# Patient Record
Sex: Male | Born: 1980 | Race: Black or African American | Hispanic: No | Marital: Single | Smoking: Current every day smoker
Health system: Southern US, Community
[De-identification: ages and names within clinical notes are randomized; demographics above are authoritative.]

## PROBLEM LIST (undated history)

## (undated) DIAGNOSIS — S4990XA Unspecified injury of shoulder and upper arm, unspecified arm, initial encounter: Secondary | ICD-10-CM

---
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
## 2014-01-28 ENCOUNTER — Encounter (HOSPITAL_COMMUNITY): Payer: Self-pay | Admitting: Emergency Medicine

## 2014-01-28 ENCOUNTER — Emergency Department (HOSPITAL_COMMUNITY): Payer: Self-pay

## 2014-01-28 ENCOUNTER — Emergency Department (HOSPITAL_COMMUNITY)
Admission: EM | Admit: 2014-01-28 | Discharge: 2014-01-28 | Disposition: A | Discharge status: Home / Self Care | Payer: Self-pay | Attending: Emergency Medicine | Admitting: Emergency Medicine

## 2014-01-28 DIAGNOSIS — Y9389 Activity, other specified: Secondary | ICD-10-CM | POA: Insufficient documentation

## 2014-01-28 DIAGNOSIS — S60229A Contusion of unspecified hand, initial encounter: Secondary | ICD-10-CM | POA: Insufficient documentation

## 2014-01-28 DIAGNOSIS — S60221A Contusion of right hand, initial encounter: Secondary | ICD-10-CM

## 2014-01-28 DIAGNOSIS — W208XXA Other cause of strike by thrown, projected or falling object, initial encounter: Secondary | ICD-10-CM | POA: Insufficient documentation

## 2014-01-28 DIAGNOSIS — F17200 Nicotine dependence, unspecified, uncomplicated: Secondary | ICD-10-CM | POA: Insufficient documentation

## 2014-01-28 DIAGNOSIS — Y9289 Other specified places as the place of occurrence of the external cause: Secondary | ICD-10-CM | POA: Insufficient documentation

## 2014-01-28 DIAGNOSIS — Y990 Civilian activity done for income or pay: Secondary | ICD-10-CM | POA: Insufficient documentation

## 2014-01-28 DIAGNOSIS — S6990XA Unspecified injury of unspecified wrist, hand and finger(s), initial encounter: Secondary | ICD-10-CM | POA: Insufficient documentation

## 2014-01-28 MED ORDER — IBUPROFEN 200 MG PO TABS
600.0000 mg | ORAL_TABLET | Freq: Once | ORAL | Status: AC
  Administered 2014-01-28: 600 mg via ORAL
  Filled 2014-01-28: qty 3

## 2014-01-28 MED ORDER — HYDROCODONE-ACETAMINOPHEN 5-325 MG PO TABS
1.0000 | ORAL_TABLET | Freq: Once | ORAL | Status: AC
  Administered 2014-01-28: 1 via ORAL
  Filled 2014-01-28: qty 1

## 2014-01-28 MED ORDER — HYDROCODONE-ACETAMINOPHEN 5-325 MG PO TABS: 1.0000 | ORAL_TABLET | Freq: Four times a day (QID) | ORAL | Status: DC | PRN

## 2014-01-28 NOTE — ED Notes (Signed)
Pt had a volt fall on hand at work

## 2014-01-28 NOTE — ED Provider Notes (Signed)
Medical screening examination/treatment/procedure(s) were performed by non-physician practitioner and as supervising physician I was immediately available for consultation/collaboration.   EKG Interpretation None        Gilda Creasehristopher J. Pollina, MD 01/28/14 2104

## 2014-01-28 NOTE — ED Provider Notes (Signed)
CSN: [id number]     Arrival date & time 01/28/14  1951 History  This chart was scribed for non-physician practitioner, Earley Favor, FNP,working with Gilda Crease, MD, by Karle Plumber, ED Scribe.  This patient was seen in room WTR5/WTR5 and the patient's care was started at 8:01 PM.  Chief Complaint  Patient presents with  . Hand Injury   The history is provided by the patient. No language interpreter was used.   HPI Comments:  John Bates is a 33 y.o. male who presents to the Emergency Department complaining of moderate right hand pain, bruising, and swelling that occurred yesterday. Pt states a large wooden vault fell on his right hand while he was working. He denies taking anything for pain since the incident. He denies numbness or tingling of the hand.   No past medical history on file. No past surgical history on file. History reviewed. No pertinent family history. History  Substance Use Topics  . Smoking status: Current Every Day Smoker    Types: Cigarettes  . Smokeless tobacco: Not on file  . Alcohol Use: Yes     Comment: occassional    Review of Systems  Musculoskeletal: Positive for joint swelling.  Skin: Negative for wound.  Neurological: Negative for numbness.  All other systems reviewed and are negative.   Allergies  Review of patient's allergies indicates no known allergies.  Home Medications   Prior to Admission medications   Not on File   Triage Vitals: BP 146/86  Pulse 90  Temp(Src) 98.2 F (36.8 C) (Oral)  Resp 20  SpO2 99% Physical Exam  Nursing note and vitals reviewed. Constitutional: He is oriented to person, place, and time. He appears well-developed and well-nourished.  HENT:  Head: Normocephalic and atraumatic.  Eyes: EOM are normal.  Neck: Normal range of motion.  Cardiovascular: Normal rate.   Pulmonary/Chest: Effort normal.  Musculoskeletal: Normal range of motion. He exhibits edema and tenderness.  Swelling over the  proximal joints of second and third fingers of right hand. Slightly decreased ROM.  Neurological: He is alert and oriented to person, place, and time.  Skin: Skin is warm and dry.  Psychiatric: He has a normal mood and affect. His behavior is normal.    ED Course  Procedures (including critical care time) DIAGNOSTIC STUDIES: Oxygen Saturation is 99% on RA, normal by my interpretation.   COORDINATION OF CARE: 8:04 PM- Will X-Ray right hand. Pt verbalizes understanding and agrees to plan.  Medications  HYDROcodone-acetaminophen (NORCO/VICODIN) 5-325 MG per tablet 1 tablet (1 tablet Oral Given 01/28/14 2042)  ibuprofen (ADVIL,MOTRIN) tablet 600 mg (600 mg Oral Given 01/28/14 2043)    Labs Review Labs Reviewed - No data to display  Imaging Review Dg Hand Complete Right  01/28/2014   CLINICAL DATA:  Recent trauma with pain  EXAM: RIGHT HAND - COMPLETE 3+ VIEW  COMPARISON:  None.  FINDINGS: No acute fracture or dislocation is noted. There are changes consistent with prior fracture of the fifth metacarpal with healing. No gross soft tissue abnormality is seen.  IMPRESSION: No acute abnormality noted.   Electronically Signed   By: Alcide Clever M.D.   On: 01/28/2014 20:31     EKG Interpretation None      MDM   Final diagnoses:  None   Will obtain xray and provide pain control Patient informed of xray results  Hand wrapped in ACE bandage for comfort     I personally performed the services described in this documentation,  which was scribed in my presence. The recorded information has been reviewed and is accurate.    Arman Filter, NP 01/28/14 [phone]

## 2014-01-28 NOTE — Discharge Instructions (Signed)
Your xray is normal

## 2014-02-02 ENCOUNTER — Ambulatory Visit: Payer: Self-pay | Attending: Internal Medicine | Admitting: Internal Medicine

## 2014-02-02 ENCOUNTER — Encounter: Payer: Self-pay | Admitting: Internal Medicine

## 2014-02-02 VITALS — BP 131/80 | HR 78 | Temp 98.4°F | Resp 16 | Ht 75.0 in | Wt 222.0 lb

## 2014-02-02 DIAGNOSIS — F17200 Nicotine dependence, unspecified, uncomplicated: Secondary | ICD-10-CM | POA: Insufficient documentation

## 2014-02-02 DIAGNOSIS — S6991XS Unspecified injury of right wrist, hand and finger(s), sequela: Secondary | ICD-10-CM

## 2014-02-02 DIAGNOSIS — Y990 Civilian activity done for income or pay: Secondary | ICD-10-CM | POA: Insufficient documentation

## 2014-02-02 DIAGNOSIS — Y9289 Other specified places as the place of occurrence of the external cause: Secondary | ICD-10-CM | POA: Insufficient documentation

## 2014-02-02 DIAGNOSIS — IMO0001 Reserved for inherently not codable concepts without codable children: Secondary | ICD-10-CM

## 2014-02-02 DIAGNOSIS — IMO0002 Reserved for concepts with insufficient information to code with codable children: Secondary | ICD-10-CM | POA: Insufficient documentation

## 2014-02-02 DIAGNOSIS — S6990XA Unspecified injury of unspecified wrist, hand and finger(s), initial encounter: Secondary | ICD-10-CM | POA: Insufficient documentation

## 2014-02-02 MED ORDER — HYDROCODONE-ACETAMINOPHEN 5-325 MG PO TABS: 1.0000 | ORAL_TABLET | Freq: Four times a day (QID) | ORAL | Status: DC | PRN

## 2014-02-02 NOTE — Progress Notes (Signed)
Patient ID: John Bates, male   DOB: 04-07-1981, 33 y.o.   MRN: [id number]  [id number]:[id number]  [id number]  DOB - [birth year]  CC:  Chief Complaint  Patient presents with  . Establish Care  . Hand Injury    right       HPI: John Bates is a 33 y.o. male here today to establish medical care.  Patient reports that he smashed his hand in a wooden vault at work a few days ago and was evaluated in the ER.  X-rays reveal that had was not broken.  Patient has been taking vicodin for pain.  Patient reports that he has to work and that he is unable to afford being out of work.  Patient reports that he has continued to use that hand for work since day after injury.   No Known Allergies History reviewed. No pertinent past medical history. Current Outpatient Prescriptions on File Prior to Visit  Medication Sig Dispense Refill  . HYDROcodone-acetaminophen (NORCO/VICODIN) 5-325 MG per tablet Take 1 tablet by mouth every 6 (six) hours as needed for moderate pain.  12 tablet  0   No current facility-administered medications on file prior to visit.   History reviewed. No pertinent family history. History   Social History  . Marital Status: Single    Spouse Name: N/A    Number of Children: N/A  . Years of Education: N/A   Occupational History  . Not on file.   Social History Main Topics  . Smoking status: Current Every Day Smoker    Types: Cigarettes  . Smokeless tobacco: Not on file  . Alcohol Use: Yes     Comment: occassional  . Drug Use: No  . Sexual Activity: Not on file   Other Topics Concern  . Not on file   Social History Narrative  . No narrative on file    Review of Systems: Constitutional: Negative for fever, chills, diaphoresis, activity change, appetite change and fatigue. HENT: Negative for ear pain, nosebleeds, congestion, facial swelling, rhinorrhea, neck pain, neck stiffness and ear discharge.  Eyes: Negative for pain, discharge, redness, itching and visual  disturbance. Respiratory: Negative for cough, choking, chest tightness, shortness of breath, wheezing and stridor.  Cardiovascular: Negative for chest pain, palpitations and leg swelling. Gastrointestinal: Negative for abdominal distention. Genitourinary: Negative for dysuria, urgency, frequency, hematuria, flank pain, decreased urine volume, difficulty urinating and dyspareunia.  Musculoskeletal: Negative for back pain, joint swelling, arthralgia and gait problem. Neurological: Negative for dizziness, tremors, seizures, syncope, facial asymmetry, speech difficulty, weakness, light-headedness, numbness and headaches.  Hematological: Negative for adenopathy. Does not bruise/bleed easily. Psychiatric/Behavioral: Negative for hallucinations, behavioral problems, confusion, dysphoric mood, decreased concentration and agitation.    Objective:   Filed Vitals:   02/02/14 1105  BP: 131/80  Pulse: 78  Temp: 98.4 F (36.9 C)  Resp: 16    Physical Exam: Constitutional: Patient appears well-developed and well-nourished. No distress. HENT: Normocephalic, atraumatic, External right and left ear normal. Oropharynx is clear and moist.  Eyes: Conjunctivae and EOM are normal. PERRLA, no scleral icterus. Neck: Normal ROM. Neck supple. No JVD. No tracheal deviation. No thyromegaly. CVS: RRR, S1/S2 +, no murmurs, no gallops, no carotid bruit.  Pulmonary: Effort and breath sounds normal, no stridor, rhonchi, wheezes, rales.  Abdominal: Soft. BS +, no distension, tenderness, rebound or guarding.  Musculoskeletal: Normal range of motion.Right hand dorsum swelling and tenderness to palpate. Lymphadenopathy: No lymphadenopathy noted, cervical Neuro: Alert. Normal reflexes, muscle tone coordination. No cranial nerve deficit.  Skin: Skin is warm and dry. No rash noted. Not diaphoretic. No erythema. No pallor. Psychiatric: Normal mood and affect. Behavior, judgment, thought content normal.  No results found  for this basename: WBC, HGB, HCT, MCV, PLT   No results found for this basename: CREATININE, BUN, NA, K, CL, CO2    No results found for this basename: HGBA1C   Lipid Panel  No results found for this basename: chol, trig, hdl, cholhdl, vldl, ldlcalc       Assessment and plan:   Brayton CavesJessie was seen today for establish care and hand injury.  Diagnoses and associated orders for this visit:  Hand injury, right, sequela - HYDROcodone-acetaminophen (NORCO/VICODIN) 5-325 MG per tablet; Take 1 tablet by mouth every 6 (six) hours as needed for moderate pain. Patient will be given one time order of vicodin. Explained to patient that he will be unable to RTC for further pain medication. Narcotic policy explained in detail.   Return if symptoms worsen or fail to improve.   Holland CommonsKECK, Marcelis Wissner, NP-C [facility] [phone] 02/20/2014, 11:14 PM

## 2014-02-02 NOTE — Patient Instructions (Addendum)
Smoking Cessation Quitting smoking is important to your health and has many advantages. However, it is not always easy to quit since nicotine is a very addictive drug. Oftentimes, people try 3 times or more before being able to quit. This document explains the best ways for you to prepare to quit smoking. Quitting takes hard work and a lot of effort, but you can do it. ADVANTAGES OF QUITTING SMOKING  You will live longer, feel better, and live better.  Your body will feel the impact of quitting smoking almost immediately.  Within 20 minutes, blood pressure decreases. Your pulse returns to its normal level.  After 8 hours, carbon monoxide levels in the blood return to normal. Your oxygen level increases.  After 24 hours, the chance of having a heart attack starts to decrease. Your breath, hair, and body stop smelling like smoke.  After 48 hours, damaged nerve endings begin to recover. Your sense of taste and smell improve.  After 72 hours, the body is virtually free of nicotine. Your bronchial tubes relax and breathing becomes easier.  After 2 to 12 weeks, lungs can hold more air. Exercise becomes easier and circulation improves.  The risk of having a heart attack, stroke, cancer, or lung disease is greatly reduced.  After 1 year, the risk of coronary heart disease is cut in half.  After 5 years, the risk of stroke falls to the same as a nonsmoker.  After 10 years, the risk of lung cancer is cut in half and the risk of other cancers decreases significantly.  After 15 years, the risk of coronary heart disease drops, usually to the level of a nonsmoker.  If you are pregnant, quitting smoking will improve your chances of having a healthy baby.  The people you live with, especially any children, will be healthier.  You will have extra money to spend on things other than cigarettes. QUESTIONS TO THINK ABOUT BEFORE ATTEMPTING TO QUIT You may want to talk about your answers with your  health care provider.  Why do you want to quit?  If you tried to quit in the past, what helped and what did not?  What will be the most difficult situations for you after you quit? How will you plan to handle them?  Who can help you through the tough times? Your family? Friends? A health care provider?  What pleasures do you get from smoking? What ways can you still get pleasure if you quit? Here are some questions to ask your health care provider:  How can you help me to be successful at quitting?  What medicine do you think would be best for me and how should I take it?  What should I do if I need more help?  What is smoking withdrawal like? How can I get information on withdrawal? GET READY  Set a quit date.  Change your environment by getting rid of all cigarettes, ashtrays, matches, and lighters in your home, car, or work. Do not let people smoke in your home.  Review your past attempts to quit. Think about what worked and what did not. GET SUPPORT AND ENCOURAGEMENT You have a better chance of being successful if you have help. You can get support in many ways.  Tell your family, friends, and coworkers that you are going to quit and need their support. Ask them not to smoke around you.  Get individual, group, or telephone counseling and support. Programs are available at local hospitals and health centers. Call   your local health department for information about programs in your area.  Spiritual beliefs and practices may help some smokers quit.  Download a "quit meter" on your computer to keep track of quit statistics, such as how long you have gone without smoking, cigarettes not smoked, and money saved.  Get a self-help book about quitting smoking and staying off tobacco. LEARN NEW SKILLS AND BEHAVIORS  Distract yourself from urges to smoke. Talk to someone, go for a walk, or occupy your time with a task.  Change your normal routine. Take a different route to work.  Drink tea instead of coffee. Eat breakfast in a different place.  Reduce your stress. Take a hot bath, exercise, or read a book.  Plan something enjoyable to do every day. Reward yourself for not smoking.  Explore interactive web-based programs that specialize in helping you quit. GET MEDICINE AND USE IT CORRECTLY Medicines can help you stop smoking and decrease the urge to smoke. Combining medicine with the above behavioral methods and support can greatly increase your chances of successfully quitting smoking.  Nicotine replacement therapy helps deliver nicotine to your body without the negative effects and risks of smoking. Nicotine replacement therapy includes nicotine gum, lozenges, inhalers, nasal sprays, and skin patches. Some may be available over-the-counter and others require a prescription.  Antidepressant medicine helps people abstain from smoking, but how this works is unknown. This medicine is available by prescription.  Nicotinic receptor partial agonist medicine simulates the effect of nicotine in your brain. This medicine is available by prescription. Ask your health care provider for advice about which medicines to use and how to use them based on your health history. Your health care provider will tell you what side effects to look out for if you choose to be on a medicine or therapy. Carefully read the information on the package. Do not use any other product containing nicotine while using a nicotine replacement product.  RELAPSE OR DIFFICULT SITUATIONS Most relapses occur within the first 3 months after quitting. Do not be discouraged if you start smoking again. Remember, most people try several times before finally quitting. You may have symptoms of withdrawal because your body is used to nicotine. You may crave cigarettes, be irritable, feel very hungry, cough often, get headaches, or have difficulty concentrating. The withdrawal symptoms are only temporary. They are strongest  when you first quit, but they will go away within 10-14 days. To reduce the chances of relapse, try to:  Avoid drinking alcohol. Drinking lowers your chances of successfully quitting.  Reduce the amount of caffeine you consume. Once you quit smoking, the amount of caffeine in your body increases and can give you symptoms, such as a rapid heartbeat, sweating, and anxiety.  Avoid smokers because they can make you want to smoke.  Do not let weight gain distract you. Many smokers will gain weight when they quit, usually less than 10 pounds. Eat a healthy diet and stay active. You can always lose the weight gained after you quit.  Find ways to improve your mood other than smoking. FOR MORE INFORMATION  www.smokefree.gov  Document Released: 06/11/2001 Document Revised: 11/01/2013 Document Reviewed: 09/26/2011 Exit[employer] Patient Information 2015 [employer]. This information is not intended to replace advice given to you by your health care provider. Make sure you discuss any questions you have with your health care provider.  

## 2014-02-02 NOTE — Progress Notes (Signed)
Patient presents to establish care Recently in ED for right hand injury at work Rates 10/10 at present Wants to quit smoking

## 2014-03-24 ENCOUNTER — Encounter (HOSPITAL_COMMUNITY): Payer: Self-pay | Admitting: Emergency Medicine

## 2014-03-24 ENCOUNTER — Emergency Department (HOSPITAL_COMMUNITY)
Admission: EM | Admit: 2014-03-24 | Discharge: 2014-03-24 | Disposition: A | Discharge status: Home / Self Care | Payer: Self-pay | Attending: Emergency Medicine | Admitting: Emergency Medicine

## 2014-03-24 ENCOUNTER — Emergency Department (HOSPITAL_COMMUNITY): Payer: Self-pay

## 2014-03-24 DIAGNOSIS — Y9389 Activity, other specified: Secondary | ICD-10-CM | POA: Insufficient documentation

## 2014-03-24 DIAGNOSIS — Z791 Long term (current) use of non-steroidal anti-inflammatories (NSAID): Secondary | ICD-10-CM | POA: Insufficient documentation

## 2014-03-24 DIAGNOSIS — Y9289 Other specified places as the place of occurrence of the external cause: Secondary | ICD-10-CM | POA: Insufficient documentation

## 2014-03-24 DIAGNOSIS — X500XXA Overexertion from strenuous movement or load, initial encounter: Secondary | ICD-10-CM | POA: Insufficient documentation

## 2014-03-24 DIAGNOSIS — S46909A Unspecified injury of unspecified muscle, fascia and tendon at shoulder and upper arm level, unspecified arm, initial encounter: Secondary | ICD-10-CM | POA: Insufficient documentation

## 2014-03-24 DIAGNOSIS — F17200 Nicotine dependence, unspecified, uncomplicated: Secondary | ICD-10-CM | POA: Insufficient documentation

## 2014-03-24 DIAGNOSIS — Y990 Civilian activity done for income or pay: Secondary | ICD-10-CM | POA: Insufficient documentation

## 2014-03-24 DIAGNOSIS — S4991XA Unspecified injury of right shoulder and upper arm, initial encounter: Secondary | ICD-10-CM

## 2014-03-24 DIAGNOSIS — S4980XA Other specified injuries of shoulder and upper arm, unspecified arm, initial encounter: Secondary | ICD-10-CM | POA: Insufficient documentation

## 2014-03-24 MED ORDER — HYDROCODONE-ACETAMINOPHEN 5-325 MG PO TABS
2.0000 | ORAL_TABLET | Freq: Once | ORAL | Status: AC
  Administered 2014-03-24: 2 via ORAL
  Filled 2014-03-24: qty 2

## 2014-03-24 MED ORDER — CYCLOBENZAPRINE HCL 10 MG PO TABS: 10.0000 mg | ORAL_TABLET | Freq: Two times a day (BID) | ORAL | Status: DC | PRN

## 2014-03-24 MED ORDER — HYDROCODONE-ACETAMINOPHEN 5-325 MG PO TABS: 2.0000 | ORAL_TABLET | ORAL | Status: AC | PRN

## 2014-03-24 NOTE — ED Provider Notes (Signed)
CSN: [id number]     Arrival date & time 03/24/14  1652 History  This chart was scribed for non-physician practitioner working with Audree Camel, MD, by Roxy Cedar ED Scribe. This patient was seen in room WTR6/WTR6 and the patient's care was started at 6:27 PM   Chief Complaint  Patient presents with  . Shoulder Pain   The history is provided by the patient. No language interpreter was used.    HPI Comments: John Bates is a 33 y.o. 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 He states that it hurts to raise his arm above the height of his chest.   History reviewed. No pertinent past medical history. Past Surgical History  Procedure Laterality Date  . Frontal sinusotomy Bilateral 1991   No family history on file. History  Substance Use Topics  . Smoking status: Current Every Day Smoker    Types: Cigarettes  . Smokeless tobacco: Not on file  . Alcohol Use: Yes     Comment: occassional    Review of Systems  Musculoskeletal: Positive for arthralgias and joint swelling.       Right shoulder pain.  Skin: Negative for rash and wound.  All other systems reviewed and are negative.  Allergies  Review of patient's allergies indicates no known allergies.  Home Medications   Prior to Admission medications   Medication Sig Start Date End Date Taking? Authorizing Provider  HYDROcodone-acetaminophen (NORCO/VICODIN) 5-325 MG per tablet Take 1 tablet by mouth every 6 (six) hours as needed for moderate pain. 02/02/14   Ambrose Finland, NP  ibuprofen (ADVIL,MOTRIN) 200 MG tablet Take 200 mg by mouth every 6 (six) hours as needed for moderate pain.    Historical Provider, MD   Triage Vitals: BP 131/82  Pulse 86  Temp(Src) 98 F (36.7 C) (Oral)  Resp 16  SpO2 100%  Physical Exam  Nursing note  and vitals reviewed. Constitutional: He is oriented to person, place, and time. He appears well-developed and well-nourished. No distress.  HENT:  Head: Normocephalic and atraumatic.  Eyes: Conjunctivae and EOM are normal.  Neck: Neck supple. No tracheal deviation present.  Cardiovascular: Normal rate.   Pulmonary/Chest: Effort normal. No respiratory distress.  Musculoskeletal: Normal range of motion.  Right shoulder tenderness to palpation over AC joint. No obvious deformity of right shoulder. Slightly limited range of motion due to pain.  Neurological: He is alert and oriented to person, place, and time.  Bilateral strength intact and equal. Bilateral strength intact in arms.  Skin: Skin is warm and dry.  Psychiatric: He has a normal mood and affect. His behavior is normal.    ED Course  Procedures (including critical care time)  DIAGNOSTIC STUDIES: Oxygen Saturation is 100% on room air, normal by my interpretation.    COORDINATION OF CARE: 6:31 PM- Discussed normal imaging results with patient. Will give patient medications for pain management. Pt advised of plan for treatment and pt agrees.  Labs Review Labs Reviewed - No data to display  Imaging Review Dg Shoulder Right  03/24/2014   CLINICAL DATA:  Injuring right shoulder lifting heavy boxes yesterday. Pain of right shoulder.  EXAM: RIGHT SHOULDER - 2+ VIEW  COMPARISON:  None.  FINDINGS: There is no evidence of fracture or dislocation. Soft tissues are unremarkable.  IMPRESSION: No acute fracture or  dislocation.   Electronically Signed   By: Sherian Rein M.D.   On: 03/24/2014 17:44     EKG Interpretation None     MDM   Final diagnoses:  Right shoulder injury, initial encounter    6:59 PM Patient's xray unremarkable for acute changes. I suspect patient has a rotator cuff or AC joint injury based on physical exam. No neurovascular compromise. Vitals stable and patient afebrile. Patient will have Vicodin and Flexeril  for pain and instructions to follow up with Orthopedic surgeon. No other injury.   I personally performed the services described in this documentation, which was scribed in my presence. The recorded information has been reviewed and is accurate.     Emilia Beck, PA-C 03/24/14 1901

## 2014-03-24 NOTE — Discharge Instructions (Signed)
Take vicodin as needed for pain. Take Flexeril as needed for muscle spasm. You may take these medications together. Follow up with Dr. Turner Daniels for further evaluation. Refer to attached documents for more information.

## 2014-03-24 NOTE — ED Notes (Signed)
Pt c/o rt shoulder pain.  Has rotator cuff problems.  States he works for a [employer] and he was carrying something heavy.

## 2014-03-29 NOTE — ED Provider Notes (Signed)
Medical screening examination/treatment/procedure(s) were performed by non-physician practitioner and as supervising physician I was immediately available for consultation/collaboration.   EKG Interpretation None        Jettie Lazare T Israel Werts, MD 03/29/14 1605 

## 2014-08-31 ENCOUNTER — Emergency Department (HOSPITAL_COMMUNITY)
Admission: EM | Admit: 2014-08-31 | Discharge: 2014-08-31 | Disposition: A | Discharge status: Home / Self Care | Payer: Self-pay | Attending: Emergency Medicine | Admitting: Emergency Medicine

## 2014-08-31 ENCOUNTER — Encounter (HOSPITAL_COMMUNITY): Payer: Self-pay | Admitting: *Deleted

## 2014-08-31 ENCOUNTER — Emergency Department (HOSPITAL_COMMUNITY): Payer: Self-pay

## 2014-08-31 DIAGNOSIS — M25511 Pain in right shoulder: Secondary | ICD-10-CM

## 2014-08-31 DIAGNOSIS — Y9289 Other specified places as the place of occurrence of the external cause: Secondary | ICD-10-CM | POA: Insufficient documentation

## 2014-08-31 DIAGNOSIS — Z720 Tobacco use: Secondary | ICD-10-CM | POA: Insufficient documentation

## 2014-08-31 DIAGNOSIS — S4991XA Unspecified injury of right shoulder and upper arm, initial encounter: Secondary | ICD-10-CM | POA: Insufficient documentation

## 2014-08-31 DIAGNOSIS — S30810A Abrasion of lower back and pelvis, initial encounter: Secondary | ICD-10-CM | POA: Insufficient documentation

## 2014-08-31 DIAGNOSIS — Z79899 Other long term (current) drug therapy: Secondary | ICD-10-CM | POA: Insufficient documentation

## 2014-08-31 DIAGNOSIS — Y998 Other external cause status: Secondary | ICD-10-CM | POA: Insufficient documentation

## 2014-08-31 DIAGNOSIS — W01198A Fall on same level from slipping, tripping and stumbling with subsequent striking against other object, initial encounter: Secondary | ICD-10-CM | POA: Insufficient documentation

## 2014-08-31 DIAGNOSIS — S6991XS Unspecified injury of right wrist, hand and finger(s), sequela: Secondary | ICD-10-CM

## 2014-08-31 DIAGNOSIS — S20411A Abrasion of right back wall of thorax, initial encounter: Secondary | ICD-10-CM

## 2014-08-31 DIAGNOSIS — Y9389 Activity, other specified: Secondary | ICD-10-CM | POA: Insufficient documentation

## 2014-08-31 DIAGNOSIS — M546 Pain in thoracic spine: Secondary | ICD-10-CM

## 2014-08-31 DIAGNOSIS — S299XXA Unspecified injury of thorax, initial encounter: Secondary | ICD-10-CM | POA: Insufficient documentation

## 2014-08-31 MED ORDER — CYCLOBENZAPRINE HCL 10 MG PO TABS: 10.0000 mg | ORAL_TABLET | Freq: Two times a day (BID) | ORAL | Status: AC | PRN

## 2014-08-31 MED ORDER — IBUPROFEN 200 MG PO TABS: 400.0000 mg | ORAL_TABLET | Freq: Four times a day (QID) | ORAL | Status: AC | PRN

## 2014-08-31 MED ORDER — HYDROCODONE-ACETAMINOPHEN 5-325 MG PO TABS: 1.0000 | ORAL_TABLET | Freq: Four times a day (QID) | ORAL | Status: AC | PRN

## 2014-08-31 MED ORDER — OXYCODONE-ACETAMINOPHEN 5-325 MG PO TABS
1.0000 | ORAL_TABLET | Freq: Once | ORAL | Status: AC
  Administered 2014-08-31: 1 via ORAL
  Filled 2014-08-31: qty 1

## 2014-08-31 NOTE — ED Notes (Signed)
Patient has not returned from Xray.  

## 2014-08-31 NOTE — ED Provider Notes (Signed)
CSN: [id number]     Arrival date & time 08/31/14  1439 History  This chart was scribed for Junius Finner, PA-C, working with Harrold Donath R. Rubin Payor, MD by Chestine Spore, ED Scribe. The patient was seen in room TR07C/TR07C at 4:27 PM.    Chief Complaint  Patient presents with  . Shoulder Pain  . Back Pain      The history is provided by the patient. No language interpreter was used.    HPI Comments: John Bates is a 34 y.o. male who presents to the Emergency Department complaining of constant right shoulder pain onset 2 days ago. Pt describes the right shoulder pain as aching, throbbing, and tender.  Pain is 10/10 at worst. Pt was helping a friend move and a moving truck door swung and hit the pt in the back. Pt has had a right clavicle injury 3 months ago and he was referred to see a specialist but he has not yet. He states that he is having associated symptoms of back pain. He states that he has tried ibuprofen with no relief for his symptoms. He denies any other symptoms.   History reviewed. No pertinent past medical history. Past Surgical History  Procedure Laterality Date  . Frontal sinusotomy Bilateral 1991   History reviewed. No pertinent family history. History  Substance Use Topics  . Smoking status: Current Every Day Smoker    Types: Cigarettes  . Smokeless tobacco: Not on file  . Alcohol Use: Yes     Comment: occassional    Review of Systems  Musculoskeletal: Positive for myalgias, back pain and arthralgias.      Allergies  Review of patient's allergies indicates no known allergies.  Home Medications   Prior to Admission medications   Medication Sig Start Date End Date Taking? Authorizing Provider  cyclobenzaprine (FLEXERIL) 10 MG tablet Take 1 tablet (10 mg total) by mouth 2 (two) times daily as needed for muscle spasms. 08/31/14   Junius Finner, PA-C  HYDROcodone-acetaminophen (NORCO/VICODIN) 5-325 MG per tablet Take 2 tablets by mouth every 4 (four) hours as  needed for moderate pain or severe pain. Patient not taking: Reported on 08/31/2014 03/24/14   Emilia Beck, PA-C  HYDROcodone-acetaminophen (NORCO/VICODIN) 5-325 MG per tablet Take 1 tablet by mouth every 6 (six) hours as needed for moderate pain. 08/31/14   Junius Finner, PA-C  ibuprofen (ADVIL,MOTRIN) 200 MG tablet Take 2 tablets (400 mg total) by mouth every 6 (six) hours as needed for moderate pain. 08/31/14   Junius Finner, PA-C   BP 140/88 mmHg  Pulse 74  Temp(Src) 98.5 F (36.9 C) (Oral)  Resp 14  SpO2 97% Physical Exam  Constitutional: He is oriented to person, place, and time. He appears well-developed and well-nourished.  HENT:  Head: Normocephalic and atraumatic.  Eyes: EOM are normal.  Neck: Normal range of motion.  Cardiovascular: Normal rate.   Pulses:      Radial pulses are 2+ on the right side.  Pulmonary/Chest: Effort normal.  Musculoskeletal: Normal range of motion.       Right shoulder: He exhibits tenderness. He exhibits no crepitus and no deformity.       Cervical back: Normal. He exhibits no tenderness.       Thoracic back: He exhibits edema.       Lumbar back: Normal. He exhibits no tenderness.  Back: Midline spinal tenderness to mid thoracic spine and right side thoracic muscles. Mild edema with overlying abrasion. No cervical or lumbar tenderness.   Right  shoulder: no obvious deformity. Limited abduction to 110 degrees due to pain. Tenderness over AC joint, no crepitus. 5/5 grip strength bilaterally.   Neurological: He is alert and oriented to person, place, and time.  Skin: Skin is warm and dry.  Psychiatric: He has a normal mood and affect. His behavior is normal.  Nursing note and vitals reviewed.   ED Course  Procedures (including critical care time) DIAGNOSTIC STUDIES: Oxygen Saturation is 100% on room air, normal by my interpretation.    COORDINATION OF CARE: 4:34 PM-Discussed treatment plan which includes right shoulder X-ray, percocet, Ice, and  T-spine x-ray with pt at bedside and pt agreed to plan.   Labs Review Labs Reviewed - No data to display  Imaging Review Dg Thoracic Spine 2 View  08/31/2014   CLINICAL DATA:  Thoracic back pain. Hit in back with truck door 2 days ago. Abrasion to the right lower thoracic spine. Initial encounter.  EXAM: THORACIC SPINE - 2 VIEW  COMPARISON:  None  FINDINGS: Slight curvature of the thoracic spine may be positional. No listhesis is identified. Vertebral body heights are preserved. Intervertebral disc space heights are preserved. Visualized portions of the lungs are grossly clear.  IMPRESSION: Negative.   Electronically Signed   By: Sebastian AcheAllen  Grady   On: 08/31/2014 17:29   Dg Shoulder Right  08/31/2014   CLINICAL DATA:  Right shoulder pain post injury 2 days ago  EXAM: RIGHT SHOULDER - 2+ VIEW  COMPARISON:  03/24/2014  FINDINGS: Three views of the right shoulder submitted. No acute fracture or or subluxation. Glenohumeral joint is preserved. AC joint is unremarkable.  IMPRESSION: Negative.   Electronically Signed   By: Natasha MeadLiviu  Pop M.D.   On: 08/31/2014 15:46      EKG Interpretation None      MDM   Final diagnoses:  Right-sided thoracic back pain  Abrasion of back, right, initial encounter  Right shoulder pain    Plain films: negative for acute injury.  Will tx symptomatically. Advised to f/u with PCP as needed for recheck of symptoms. Return precautions provided. Pt verbalized understanding and agreement with tx plan.   I personally performed the services described in this documentation, which was scribed in my presence. The recorded information has been reviewed and is accurate.    Junius Finnerrin O'Malley, PA-C 08/31/14 2318  Juliet RudeNathan R. Rubin PayorPickering, MD 09/01/14 [id number]

## 2014-08-31 NOTE — ED Notes (Signed)
Pt in stating that he fell while helping a friend move and hit his back and right shoulder, c/o continued pain to these areas, no distress noted

## 2014-12-06 ENCOUNTER — Emergency Department (HOSPITAL_COMMUNITY)
Admission: EM | Admit: 2014-12-06 | Discharge: 2014-12-06 | Disposition: A | Discharge status: Home / Self Care | Payer: Self-pay | Attending: Emergency Medicine | Admitting: Emergency Medicine

## 2014-12-06 ENCOUNTER — Encounter (HOSPITAL_COMMUNITY): Payer: Self-pay | Admitting: Emergency Medicine

## 2014-12-06 ENCOUNTER — Emergency Department (HOSPITAL_COMMUNITY): Payer: Self-pay

## 2014-12-06 DIAGNOSIS — Y9389 Activity, other specified: Secondary | ICD-10-CM | POA: Insufficient documentation

## 2014-12-06 DIAGNOSIS — Z720 Tobacco use: Secondary | ICD-10-CM | POA: Insufficient documentation

## 2014-12-06 DIAGNOSIS — Y990 Civilian activity done for income or pay: Secondary | ICD-10-CM | POA: Insufficient documentation

## 2014-12-06 DIAGNOSIS — M25511 Pain in right shoulder: Secondary | ICD-10-CM

## 2014-12-06 DIAGNOSIS — W208XXA Other cause of strike by thrown, projected or falling object, initial encounter: Secondary | ICD-10-CM | POA: Insufficient documentation

## 2014-12-06 DIAGNOSIS — Y929 Unspecified place or not applicable: Secondary | ICD-10-CM | POA: Insufficient documentation

## 2014-12-06 DIAGNOSIS — S4991XA Unspecified injury of right shoulder and upper arm, initial encounter: Secondary | ICD-10-CM | POA: Insufficient documentation

## 2014-12-06 HISTORY — DX: Unspecified injury of shoulder and upper arm, unspecified arm, initial encounter: S49.90XA

## 2014-12-06 MED ORDER — TRAMADOL HCL 50 MG PO TABS
50.0000 mg | ORAL_TABLET | Freq: Once | ORAL | Status: AC
  Administered 2014-12-06: 50 mg via ORAL
  Filled 2014-12-06: qty 1

## 2014-12-06 MED ORDER — TRAMADOL HCL 50 MG PO TABS: 50.0000 mg | ORAL_TABLET | Freq: Four times a day (QID) | ORAL | Status: AC | PRN

## 2014-12-06 NOTE — Discharge Instructions (Signed)
Please use ice, ibuprofen or Tylenol as needed for pain. Please use tramadol for breakthrough pain. Please follow-up with your primary care provider in one week if symptoms continue to persist. Please follow-up sooner if needed.

## 2014-12-06 NOTE — ED Notes (Signed)
Pt stated that a large, heavy box was handed to him from overhead and dropped on to r/shoulder. Pt works for moving company

## 2014-12-06 NOTE — ED Provider Notes (Signed)
CSN: [id number]     Arrival date & time 12/06/14  1631 History  This chart was scribed for a non-physician practitioner, Eyvonne MechanicJeffrey 'Aundria Bitterman, PA-C working with Benjiman CoreNathan Pickering, MD by [place] Peace, ED Scribe. The patient was seen in WTR8/WTR8. The patient's care was started at 5:54 PM.    Chief Complaint  Patient presents with  . Shoulder Injury    large box dropped on to shoulder     Patient is a 34 y.o. male presenting with shoulder injury. The history is provided by the patient. No language interpreter was used.  Shoulder Injury This is a new problem. The current episode started less than 1 hour ago. He has tried nothing for the symptoms.    HPI Comments: John Bates is a 34 y.o. male who presents to the Emergency Department complaining of right shoulder pain that occurred earlier while assisting in moving a large box overhead and had it drop. He explains that his initial reaction was to catch the box which was extremely heavy, causing him to injure shoulder. Pt works for [employer]. Patient reports he has been able to use the extremity but with pain, denies loss of distal sensation strength or function of the extremity. He reports that usually he works through these episodes but wanted be evaluated further to make sure that there is no concerning damage at this time. Patient reports he use ibuprofen and Tylenol home with mild relief of symptoms.   Past Medical History  Diagnosis Date  . Shoulder injury    Past Surgical History  Procedure Laterality Date  . Frontal sinusotomy Bilateral 1991   History reviewed. No pertinent family history. History  Substance Use Topics  . Smoking status: Current Every Day Smoker    Types: Cigarettes  . Smokeless tobacco: Not on file  . Alcohol Use: Yes     Comment: occassional    Review of Systems  Musculoskeletal: Positive for arthralgias.       Right shoulder pain.   Neurological: Negative for weakness and numbness.  All other systems  reviewed and are negative.     Allergies  Review of patient's allergies indicates no known allergies.  Home Medications   Prior to Admission medications   Medication Sig Start Date End Date Taking? Authorizing Provider  cyclobenzaprine (FLEXERIL) 10 MG tablet Take 1 tablet (10 mg total) by mouth 2 (two) times daily as needed for muscle spasms. Patient not taking: Reported on 12/06/2014 08/31/14   Junius FinnerErin O'Malley, PA-C  HYDROcodone-acetaminophen (NORCO/VICODIN) 5-325 MG per tablet Take 2 tablets by mouth every 4 (four) hours as needed for moderate pain or severe pain. Patient not taking: Reported on 08/31/2014 03/24/14   Emilia BeckKaitlyn Szekalski, PA-C  HYDROcodone-acetaminophen (NORCO/VICODIN) 5-325 MG per tablet Take 1 tablet by mouth every 6 (six) hours as needed for moderate pain. Patient not taking: Reported on 12/06/2014 08/31/14   Junius FinnerErin O'Malley, PA-C  ibuprofen (ADVIL,MOTRIN) 200 MG tablet Take 2 tablets (400 mg total) by mouth every 6 (six) hours as needed for moderate pain. Patient not taking: Reported on 12/06/2014 08/31/14   Junius FinnerErin O'Malley, PA-C  traMADol (ULTRAM) 50 MG tablet Take 1 tablet (50 mg total) by mouth every 6 (six) hours as needed. 12/06/14   Jakelin Taussig, PA-C   BP 142/90 mmHg  Pulse 90  Temp(Src) 98.4 F (36.9 C) (Oral)  Resp 20  SpO2 99% Physical Exam  Constitutional: He is oriented to person, place, and time. He appears well-developed and well-nourished. No distress.  HENT:  Head:  Normocephalic and atraumatic.  Eyes: Conjunctivae and EOM are normal.  Neck: Neck supple. No tracheal deviation present.  Cardiovascular: Normal rate.   Pulmonary/Chest: Effort normal. No respiratory distress.  Musculoskeletal: He exhibits tenderness.  Shoulders are equal and symmetrical.  Right shoulder- No obvious deformities. No signs of trauma. Right shoulder is TTP of anterior and right lateral aspect. Decreased ROM in all directions due to pain. Specifically pain with flexion, abduction. Distal  sensation strength intact. Grip strength 5/5. Profusion intact.   Neurological: He is alert and oriented to person, place, and time.  Skin: Skin is warm and dry.  Psychiatric: He has a normal mood and affect. His behavior is normal.  Nursing note and vitals reviewed.   ED Course  Procedures (including critical care time) Labs Review Labs Reviewed - No data to display  Imaging Review Dg Shoulder Right  12/06/2014   CLINICAL DATA:  Large heavy object dropped on right shoulder. Right shoulder pain.  EXAM: RIGHT SHOULDER - 2+ VIEW  COMPARISON:  08/31/2014  FINDINGS: Mild degenerative changes in the right AC joint. Glenohumeral joint is intact. No acute bony abnormality. Specifically, no fracture, subluxation, or dislocation. Soft tissues are intact.  IMPRESSION: No acute bony abnormality.   Electronically Signed   By: Charlett Nose M.D.   On: 12/06/2014 17:45     EKG Interpretation None     Medications  traMADol (ULTRAM) tablet 50 mg (50 mg Oral Given 12/06/14 1823)    6:00 PM- Treatment plan was discussed with patient who verbalizes understanding and agrees.   MDM   Final diagnoses:  Shoulder pain, acute, right    Labs: None indicated  Imaging: DG shoulder right no significant findings  Consults: None  Therapeutics: Tramadol  Assessment: Shoulder pain  Plan: Patient presents with shoulder pain, no bony abnormality, nonspecific exam, no concerning findings. Patient was given a small course of tramadol, instructed to use ibuprofen ice heat and rest, and follow-up with orthopedist if symptoms continue to persist. Patient verbalizes understanding and agreement for today's plan and had no further questions or concerns.    I personally performed the services described in this documentation, which was scribed in my presence. The recorded information has been reviewed and is accurate.   Eyvonne Mechanic, PA-C 12/07/14 1714  Benjiman Core, MD 12/12/14 [phone]

## 2015-09-27 IMAGING — CR DG HAND COMPLETE 3+V*R*
3 series · 3 of 3 positions shown · non-contrast
Comparison: None.

CLINICAL DATA: Recent trauma with pain

EXAM:
RIGHT HAND - COMPLETE 3+ VIEW

[x hand pa right]
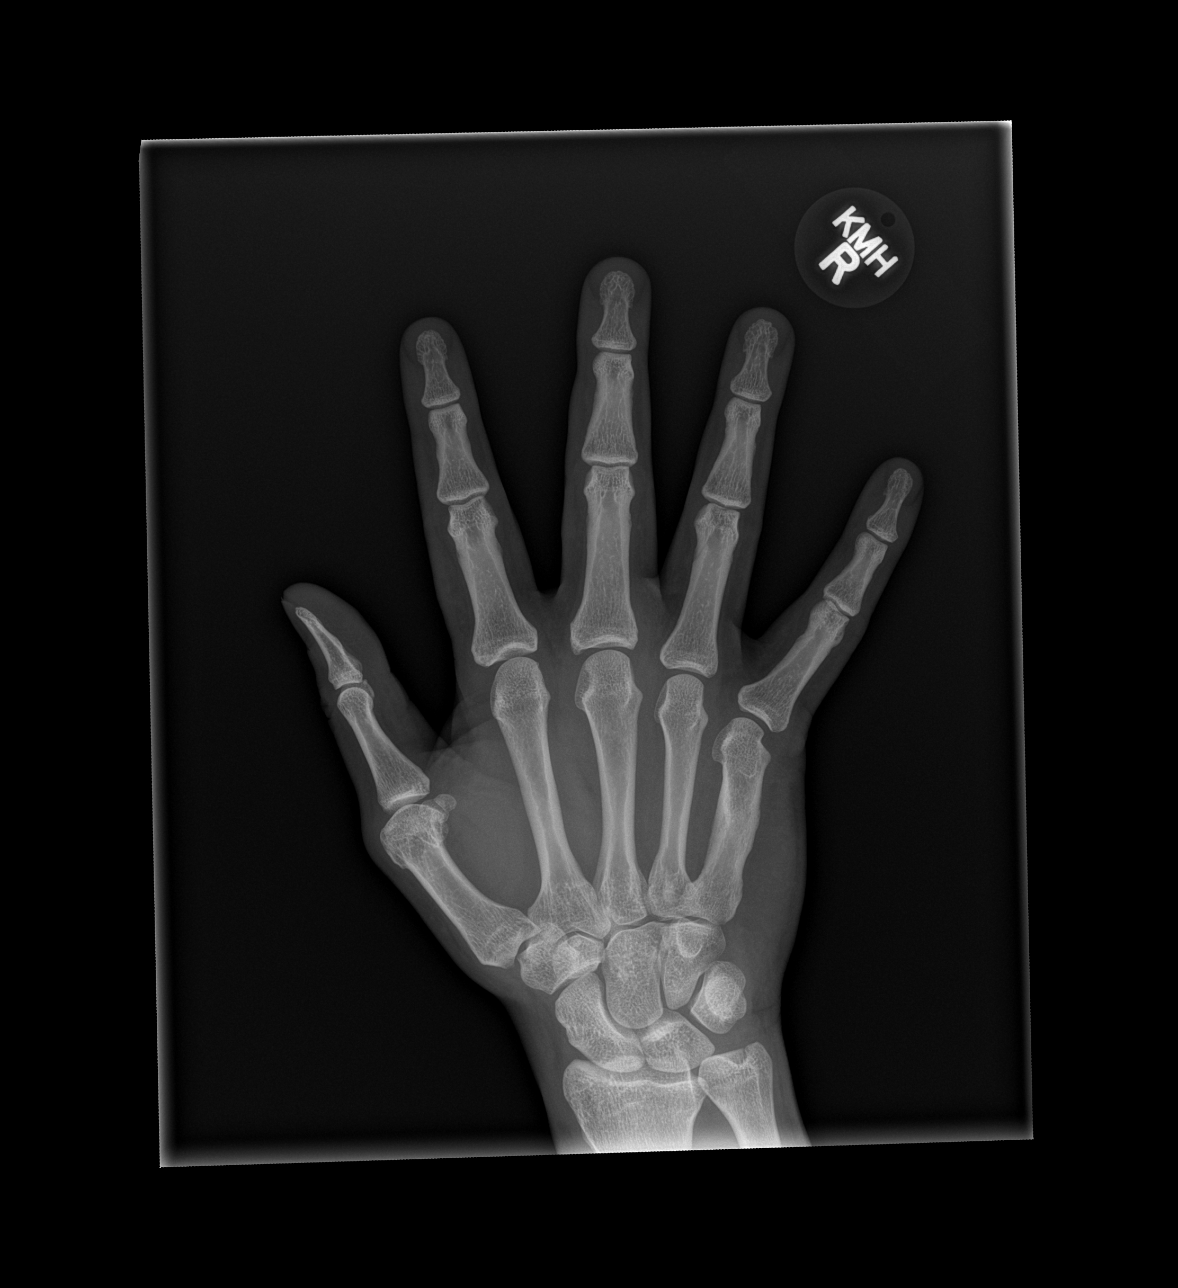

[x hand obl right]
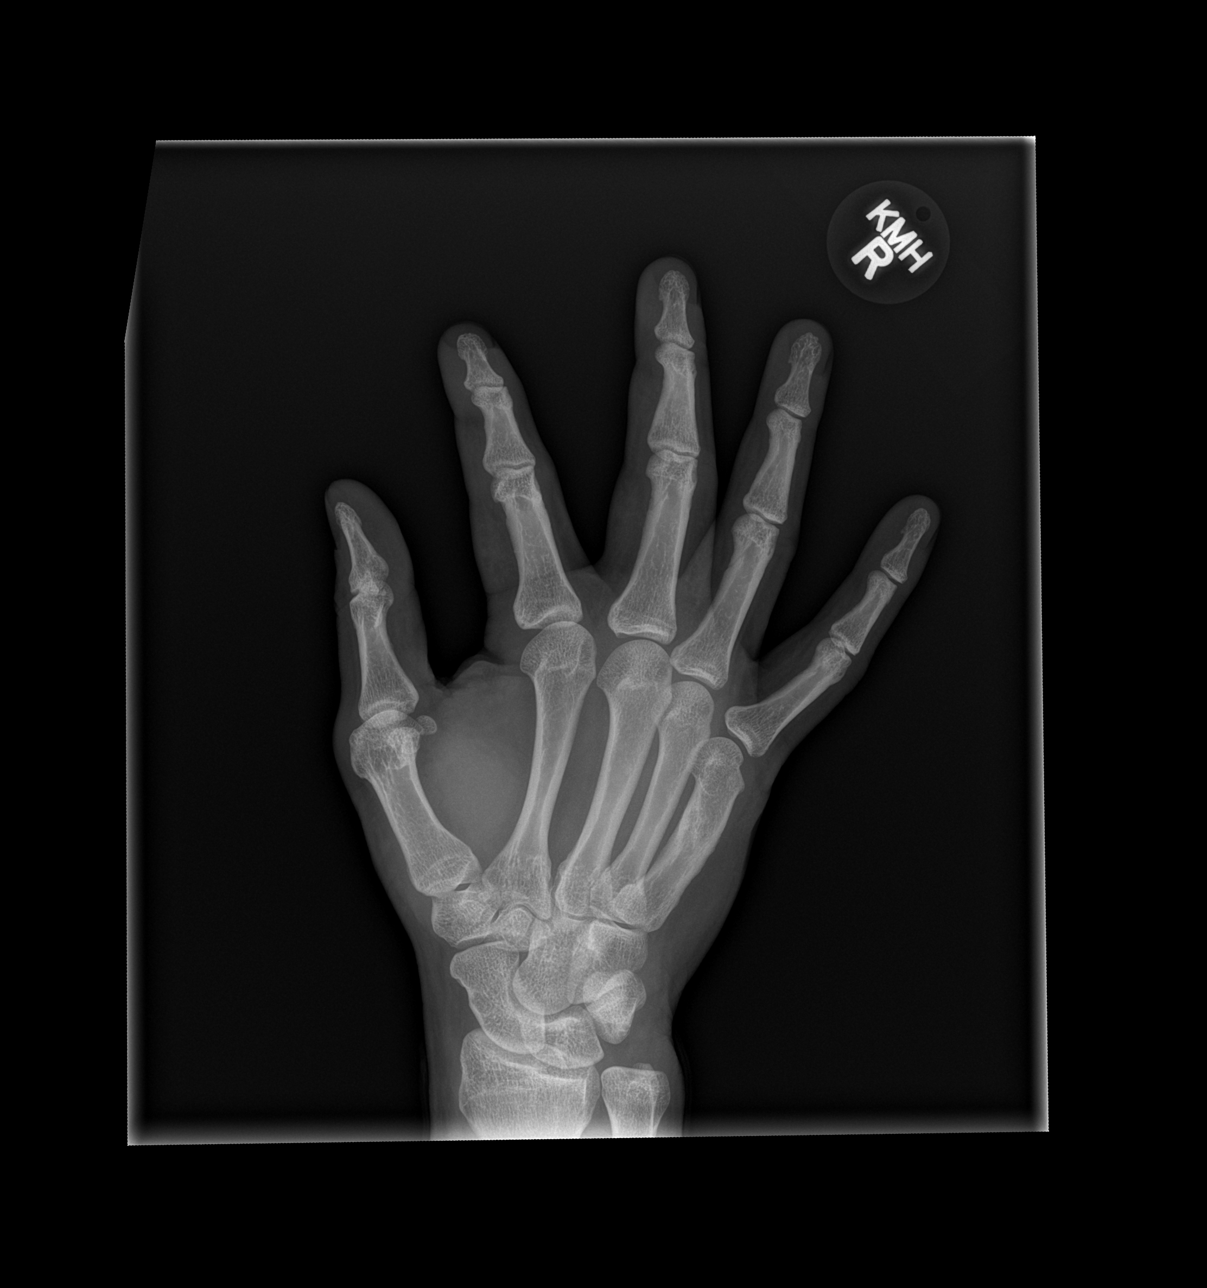

[x hand lat right]
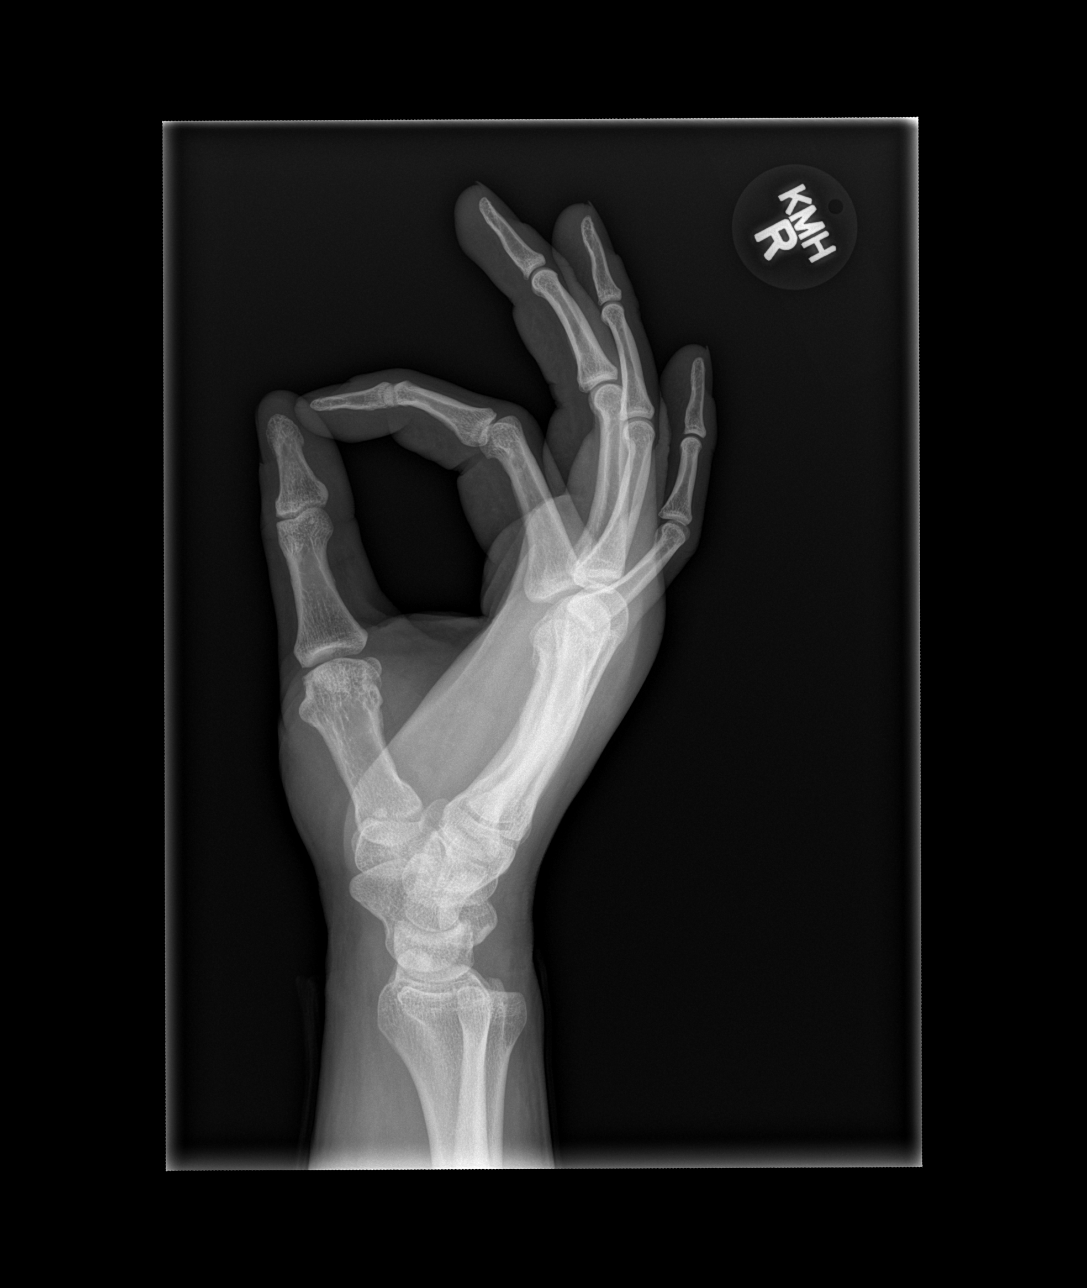

[3 of 3 positions shown; findings below may reference images not displayed]

FINDINGS: No acute fracture or dislocation is noted. There are changes
consistent with prior fracture of the fifth metacarpal with healing.
No gross soft tissue abnormality is seen.
IMPRESSION: No acute abnormality noted.

## 2016-08-04 IMAGING — CR DG SHOULDER 2+V*R*
3 series · 3 of 3 positions shown · non-contrast
Comparison: 08/31/2014

CLINICAL DATA: Large heavy object dropped on right shoulder. Right
shoulder pain.

EXAM:
RIGHT SHOULDER - 2+ VIEW

[w shoulder external right]
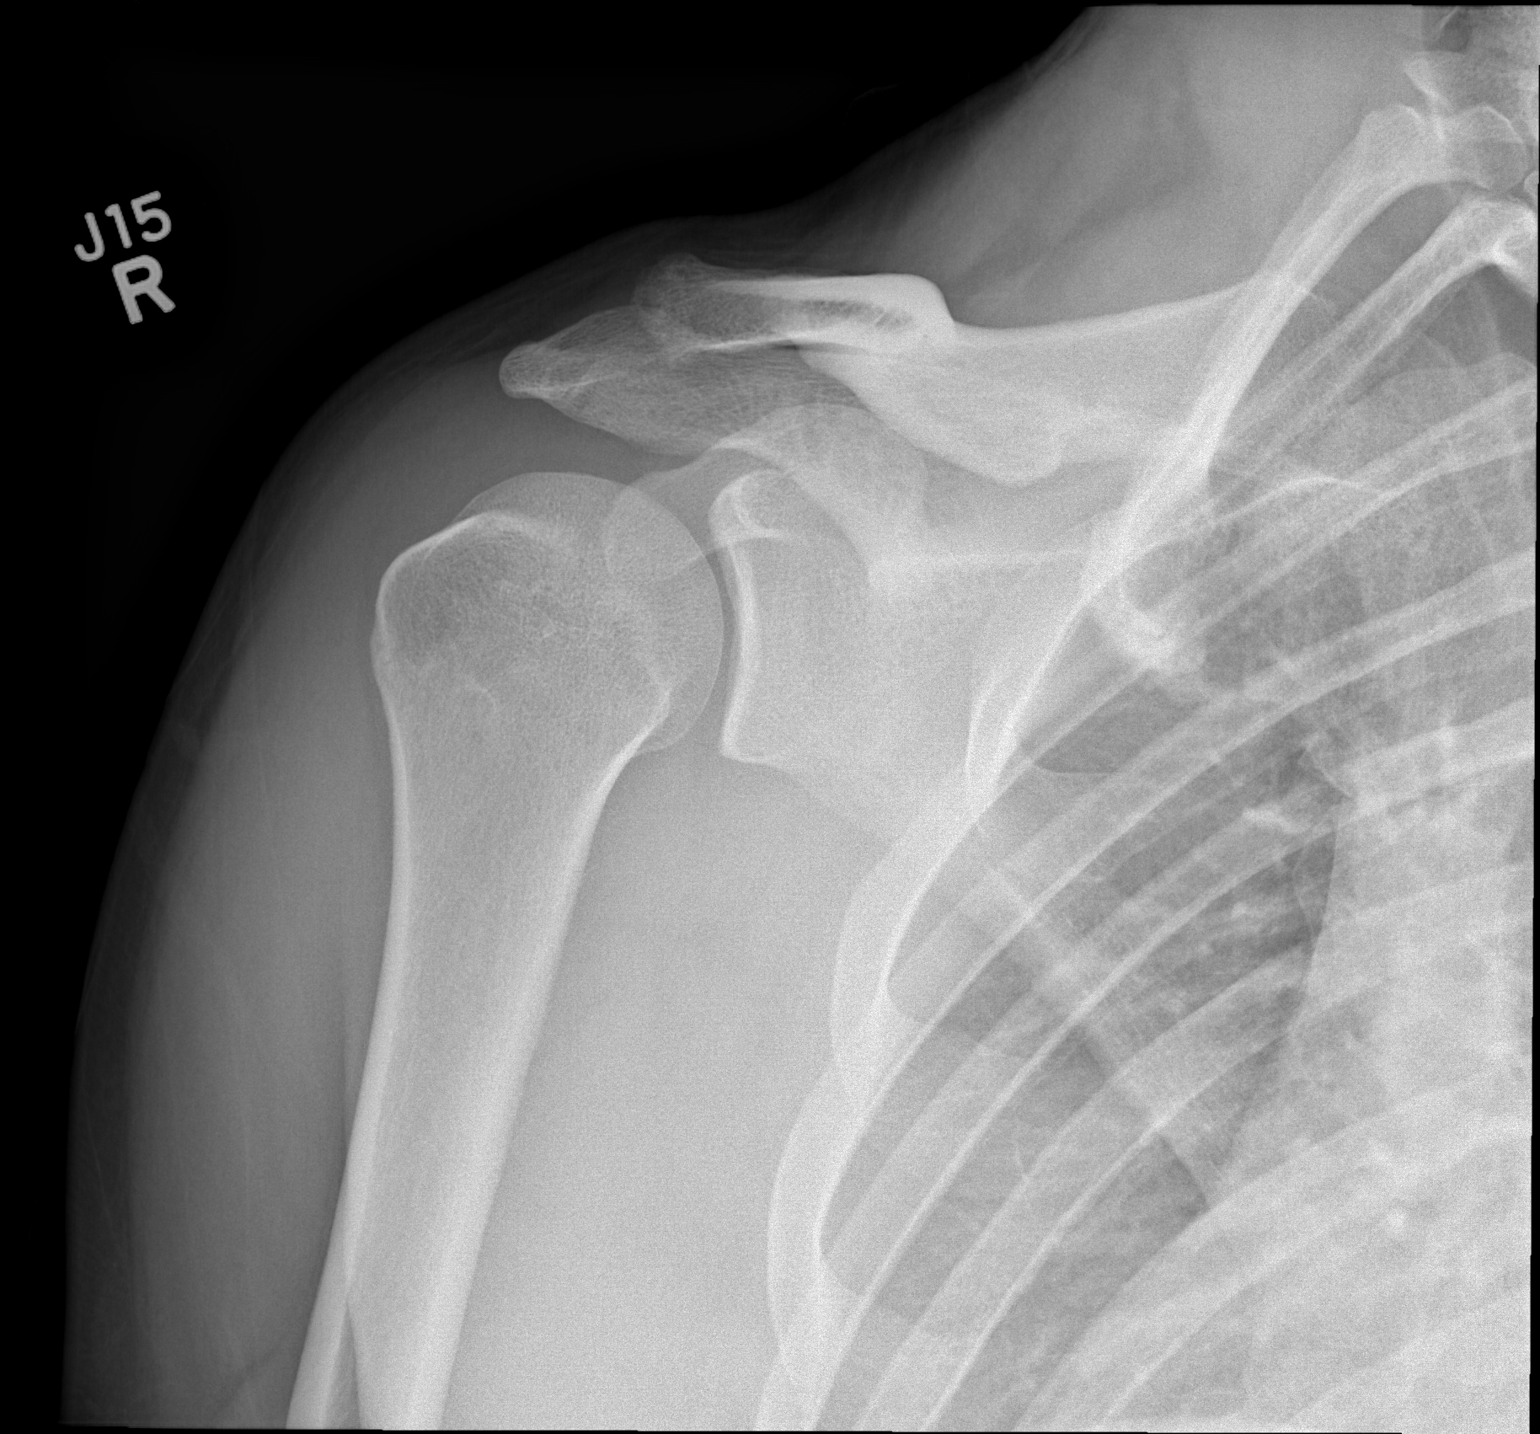

[w shoulder y-view right]
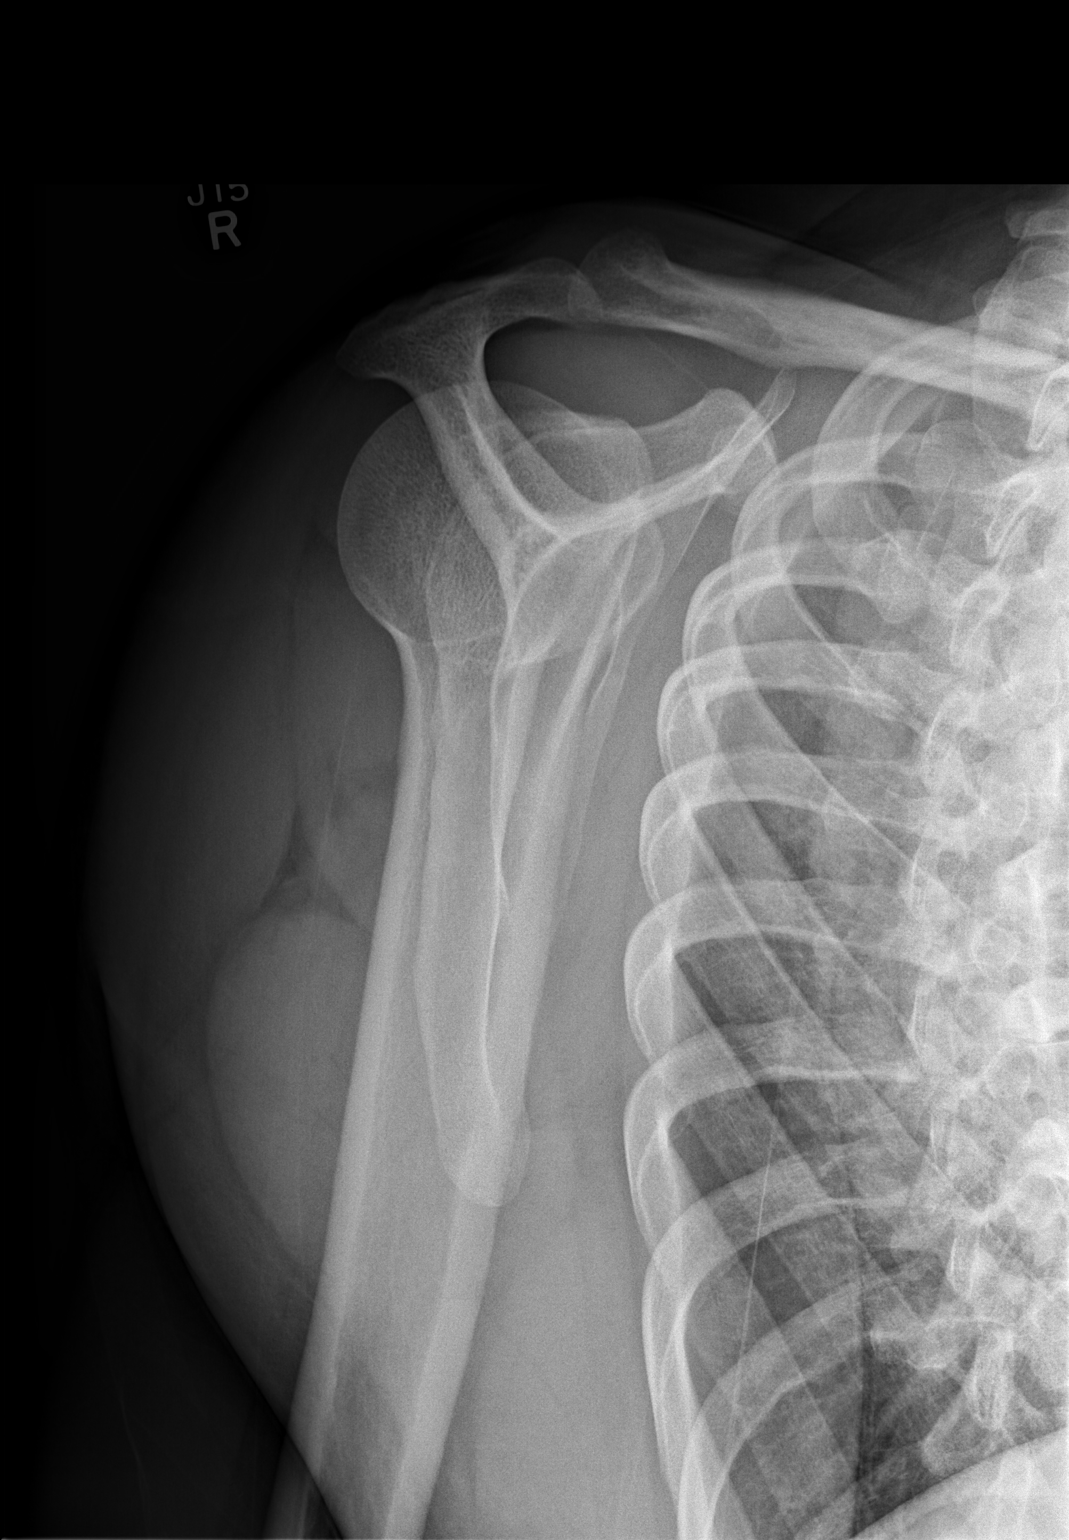

[w shoulder axillary right]
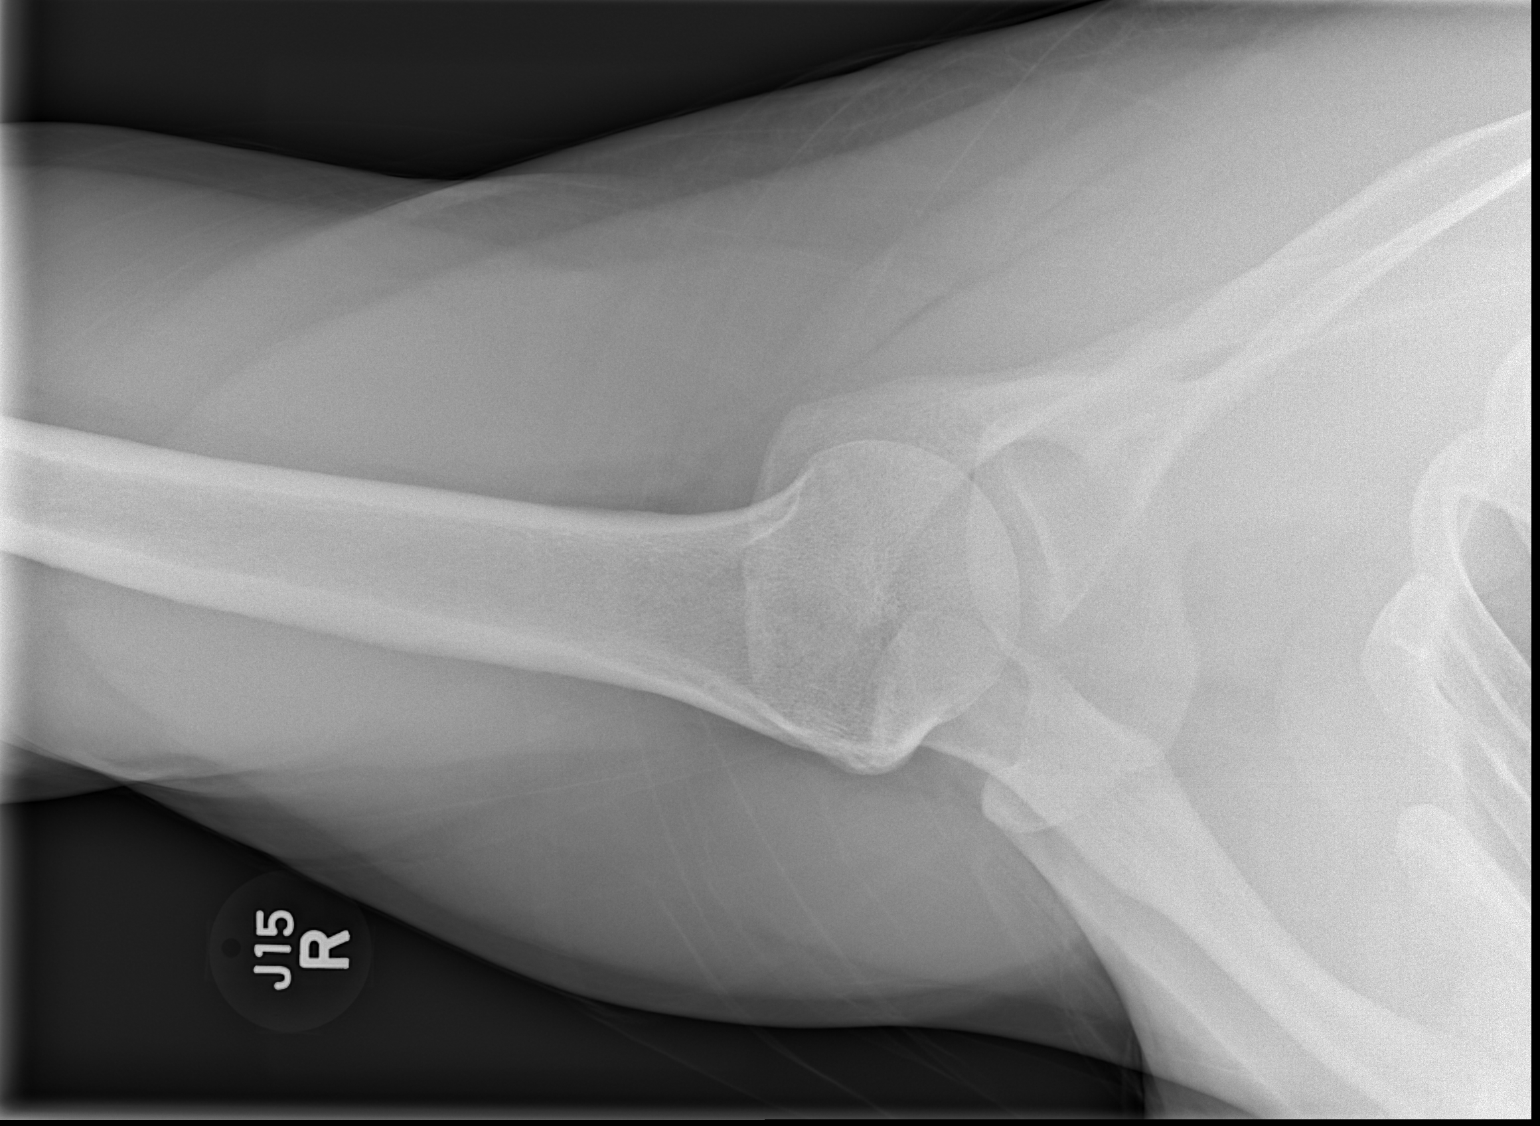

[3 of 3 positions shown; findings below may reference images not displayed]

FINDINGS: Mild degenerative changes in the right AC joint. Glenohumeral joint
is intact. No acute bony abnormality. Specifically, no fracture,
subluxation, or dislocation. Soft tissues are intact.
IMPRESSION: No acute bony abnormality.
